# Patient Record
Sex: Male | Born: 1990 | Race: White | Hispanic: No | Marital: Married | Smoking: Current every day smoker
Health system: Southern US, Community
[De-identification: ages and names within clinical notes are randomized; demographics above are authoritative.]

---

## 2019-05-07 DIAGNOSIS — Z20822 Contact with and (suspected) exposure to covid-19: Secondary | ICD-10-CM | POA: Insufficient documentation

## 2019-05-07 DIAGNOSIS — F23 Brief psychotic disorder: Secondary | ICD-10-CM | POA: Insufficient documentation

## 2019-05-07 DIAGNOSIS — F17290 Nicotine dependence, other tobacco product, uncomplicated: Secondary | ICD-10-CM | POA: Insufficient documentation

## 2019-05-07 DIAGNOSIS — F1910 Other psychoactive substance abuse, uncomplicated: Secondary | ICD-10-CM | POA: Insufficient documentation
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
## 2019-05-08 ENCOUNTER — Encounter: Payer: Self-pay | Admitting: Emergency Medicine

## 2019-05-08 ENCOUNTER — Other Ambulatory Visit: Payer: Self-pay

## 2019-05-08 ENCOUNTER — Emergency Department
Admission: EM | Admit: 2019-05-08 | Discharge: 2019-05-08 | Disposition: A | Discharge status: Home / Self Care | Payer: Self-pay | Attending: Emergency Medicine | Admitting: Emergency Medicine

## 2019-05-08 ENCOUNTER — Emergency Department: Payer: Self-pay

## 2019-05-08 DIAGNOSIS — F23 Brief psychotic disorder: Secondary | ICD-10-CM

## 2019-05-08 DIAGNOSIS — R443 Hallucinations, unspecified: Secondary | ICD-10-CM | POA: Insufficient documentation

## 2019-05-08 DIAGNOSIS — F1490 Cocaine use, unspecified, uncomplicated: Secondary | ICD-10-CM

## 2019-05-08 DIAGNOSIS — F1290 Cannabis use, unspecified, uncomplicated: Secondary | ICD-10-CM

## 2019-05-08 DIAGNOSIS — F1910 Other psychoactive substance abuse, uncomplicated: Secondary | ICD-10-CM

## 2019-05-08 LAB — URINALYSIS, COMPLETE (UACMP) WITH MICROSCOPIC
Bilirubin Urine: NEGATIVE
Glucose, UA: NEGATIVE mg/dL
Hgb urine dipstick: NEGATIVE
Ketones, ur: 20 mg/dL — AB
Leukocytes,Ua: NEGATIVE
Nitrite: NEGATIVE
Protein, ur: 100 mg/dL — AB
Specific Gravity, Urine: 1.03 (ref 1.005–1.030)
pH: 5 (ref 5.0–8.0)

## 2019-05-08 LAB — URINE DRUG SCREEN, QUALITATIVE (ARMC ONLY)
Amphetamines, Ur Screen: POSITIVE — AB
Barbiturates, Ur Screen: NOT DETECTED
Benzodiazepine, Ur Scrn: POSITIVE — AB
Cannabinoid 50 Ng, Ur ~~LOC~~: POSITIVE — AB
Cocaine Metabolite,Ur ~~LOC~~: POSITIVE — AB
MDMA (Ecstasy)Ur Screen: NOT DETECTED
Methadone Scn, Ur: NOT DETECTED
Opiate, Ur Screen: NOT DETECTED
Phencyclidine (PCP) Ur S: NOT DETECTED
Tricyclic, Ur Screen: NOT DETECTED

## 2019-05-08 LAB — ETHANOL: Alcohol, Ethyl (B): <10 mg/dL (ref <10)

## 2019-05-08 LAB — SALICYLATE LEVEL: Salicylate Lvl: <7 mg/dL — ABNORMAL LOW (ref 7.0–30.0)

## 2019-05-08 LAB — CBC
HCT: 45.4 % (ref 39.0–52.0)
Hemoglobin: 15.5 g/dL (ref 13.0–17.0)
MCH: 30.1 pg (ref 26.0–34.0)
MCHC: 34.1 g/dL (ref 30.0–36.0)
MCV: 88.2 fL (ref 80.0–100.0)
Platelets: 299 10*3/uL (ref 150–400)
RBC: 5.15 MIL/uL (ref 4.22–5.81)
RDW: 12.6 % (ref 11.5–15.5)
WBC: 9.6 10*3/uL (ref 4.0–10.5)
nRBC: 0 % (ref 0.0–0.2)

## 2019-05-08 LAB — COMPREHENSIVE METABOLIC PANEL
ALT: 24 U/L (ref 0–44)
AST: 13 U/L — ABNORMAL LOW (ref 15–41)
Albumin: 4.9 g/dL (ref 3.5–5.0)
Alkaline Phosphatase: 58 U/L (ref 38–126)
Anion gap: 11 (ref 5–15)
BUN: 18 mg/dL (ref 6–20)
CO2: 26 mmol/L (ref 22–32)
Calcium: 9.4 mg/dL (ref 8.9–10.3)
Chloride: 97 mmol/L — ABNORMAL LOW (ref 98–111)
Creatinine, Ser: 0.95 mg/dL (ref 0.61–1.24)
GFR calc Af Amer: >60 mL/min (ref >60)
GFR calc non Af Amer: >60 mL/min (ref >60)
Glucose, Bld: 114 mg/dL — ABNORMAL HIGH (ref 70–99)
Potassium: 4 mmol/L (ref 3.5–5.1)
Sodium: 134 mmol/L — ABNORMAL LOW (ref 135–145)
Total Bilirubin: 2.5 mg/dL — ABNORMAL HIGH (ref 0.3–1.2)
Total Protein: 8.1 g/dL (ref 6.5–8.1)

## 2019-05-08 LAB — RESPIRATORY PANEL BY RT PCR (FLU A&B, COVID)
Influenza A by PCR: NEGATIVE
Influenza B by PCR: NEGATIVE
SARS Coronavirus 2 by RT PCR: NEGATIVE

## 2019-05-08 LAB — ACETAMINOPHEN LEVEL: Acetaminophen (Tylenol), Serum: <10 ug/mL — ABNORMAL LOW (ref 10–30)

## 2019-05-08 NOTE — Consult Note (Signed)
Patient is reevaluated by me this morning.  Patient continues to deny any suicidal homicidal ideations and denies any hallucinations.  Patient denies having any type of paranoia and has not presented with any type of aggressive or threatening behavior.  Patient was sleeping extremely well when I went into the room and was slightly difficult to wake up.  Patient reports that he lives with his parents and they will be the ones picking up from the hospital.  At this time the patient does not meet any psychiatric inpatient treatment criteria and is psychiatrically cleared.  Restated and encouraged patient to not use medications that are not prescribed to him.  He reports that he got the medications from a friend.  Patient stated understanding and agreement.  I have rescinded the patient's IVC and notified Dr. Lenard Lance of the recommendations.

## 2019-05-08 NOTE — ED Notes (Signed)
Pt calling for ride home. IVC papers rescinded.  Pt getting dressed back into regular clothes at this time. Pt is calm and cooperative

## 2019-05-08 NOTE — Discharge Instructions (Addendum)
You have been seen in the emergency department for a  psychiatric concern. You have been evaluated both medically as well as psychiatrically. Please follow-up with your outpatient resources provided. Return to the emergency department for any worsening symptoms, or any thoughts of hurting yourself or anyone else so that we may attempt to help you. 

## 2019-05-08 NOTE — ED Notes (Signed)
Pt dressed out and plaid pants, brown shirt, gray tennis shoes, white socks, plaid boxers, and changed into burgundy scrubs.

## 2019-05-08 NOTE — ED Provider Notes (Signed)
-----------------------------------------   9:25 AM on 05/08/2019 -----------------------------------------  Patient has been seen and evaluated by psychiatry.  They believe the patient safe for discharge home from a psychiatric standpoint.  Patient's medical work-up has been largely nonrevealing besides polysubstance abuse.  Patient will be discharged home.   Minna Antis, MD 05/08/19 [phone]
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
## 2019-05-08 NOTE — BH Assessment (Signed)
Writer spoke with the patient to complete an updated/reassessment. Patient denies SI/HI and AV/H.  Patient does not meet inpatient criteria. 
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
## 2019-05-08 NOTE — Consult Note (Signed)
[facility] Face-to-Face Psychiatry Consult   Reason for Consult:  Psych evaluation Referring Physician:  Dr. Dolores Frame Patient Identification: Frederick Petersen MRN:  [id number] Principal Diagnosis: Acute psychosis (HCC) Diagnosis:  Principal Problem:   Acute psychosis (HCC)   Total Time spent with patient: 45 minutes  Subjective:   Frederick Petersen is a 29 y.o. male patient admitted "I'M sleep deprived"  HPI:  Frederick Petersen, 29 y.o., male patient seen via telepsych by this provider; chart reviewed and consulted with Dr. Lucianne Muss on 05/08/19.  On evaluation Frederick Petersen reports that he has been taking 20mg  of adderall for the past 3 days to stay awake to post pokemon cards on the Internet for his cousin.  He also admits to taking 1.5mg  of zanax as well. He say he has anxiety and takes the zanax for that reason.  He attributes his  hallucinations to the fact that he hasnt gotten any sleep. Patient is insightful and his thought process is relavant and coherent. Patient educated on the dangers of taking medication that isn't prescribed for him.  Patient conveyed understanding by verbally agreeing. At this time there is indication that he is currently experiencing delusional thought content. Patient denies SI, HI, and AVH.  Writer recommends overnight  observation and DC in the am is pschiatrically stable.    Past Psychiatric History: NO  Risk to Self:   Risk to Others:   Prior Inpatient Therapy:   Prior Outpatient Therapy:    Past Medical History: History reviewed. No pertinent past medical history. History reviewed. No pertinent surgical history. Family History: No family history on file. Family Psychiatric  History: unknown Social History:  Social History   Substance and Sexual Activity  Alcohol Use Yes     Social History   Substance and Sexual Activity  Drug Use Never    Social History   Socioeconomic History  . Marital status: Married    Spouse name: Not on file  . Number of children: Not  on file  . Years of education: Not on file  . Highest education level: Not on file  Occupational History  . Not on file  Tobacco Use  . Smoking status: Current Every Day Smoker    Types: E-cigarettes  . Smokeless tobacco: Never Used  Substance and Sexual Activity  . Alcohol use: Yes  . Drug use: Never  . Sexual activity: Not on file  Other Topics Concern  . Not on file  Social History Narrative  . Not on file   Social Determinants of Health   Financial Resource Strain:   . Difficulty of Paying Living Expenses:   Food Insecurity:   . Worried About in the Last Year:   . Programme researcher, broadcasting/film/video in the Last Year:   Transportation Needs:   . Barista (Medical):   Freight forwarder Lack of Transportation (Non-Medical):   Physical Activity:   . Days of Exercise per Week:   . Minutes of Exercise per Session:   Stress:   . Feeling of Stress :   Social Connections:   . Frequency of Communication with Friends and Family:   . Frequency of Social Gatherings with Friends and Family:   . Attends Religious Services:   . Active Member of Clubs or Organizations:   . Attends [employer] Meetings:   Banker Marital Status:    Additional Social History:    Allergies:  No Known Allergies  Labs:  Results for orders placed or performed during the  hospital encounter of 05/08/19 (from the past 48 hour(s))  Comprehensive metabolic panel     Status: Abnormal   Collection Time: 05/08/19 12:09 AM  Result Value Ref Range   Sodium 134 (L) 135 - 145 mmol/L   Potassium 4.0 3.5 - 5.1 mmol/L   Chloride 97 (L) 98 - 111 mmol/L   CO2 26 22 - 32 mmol/L   Glucose, Bld 114 (H) 70 - 99 mg/dL    Comment: Glucose reference range applies only to samples taken after fasting for at least 8 hours.   BUN 18 6 - 20 mg/dL   Creatinine, Ser 6.28 0.61 - 1.24 mg/dL   Calcium 9.4 8.9 - 36.6 mg/dL   Total Protein 8.1 6.5 - 8.1 g/dL   Albumin 4.9 3.5 - 5.0 g/dL   AST 13 (L) 15 - 41 U/L   ALT 24 0 -  44 U/L   Alkaline Phosphatase 58 38 - 126 U/L   Total Bilirubin 2.5 (H) 0.3 - 1.2 mg/dL   GFR calc non Af Amer >60 >60 mL/min   GFR calc Af Amer >60 >60 mL/min   Anion gap 11 5 - 15    Comment: Performed at [facility], [address] [place], [place] [address]  Ethanol     Status: None   Collection Time: 05/08/19 12:09 AM  Result Value Ref Range   Alcohol, Ethyl (B) <10 <10 mg/dL    Comment: (NOTE) Lowest detectable limit for serum alcohol is 10 mg/dL. For medical purposes only. Performed at [facility], [address] [place], [place] [address]   Salicylate level     Status: Abnormal   Collection Time: 05/08/19 12:09 AM  Result Value Ref Range   Salicylate Lvl <7.0 (L) 7.0 - 30.0 mg/dL    Comment: Performed at [facility], [address] [place], [place] [address]  Acetaminophen level     Status: Abnormal   Collection Time: 05/08/19 12:09 AM  Result Value Ref Range   Acetaminophen (Tylenol), Serum <10 (L) 10 - 30 ug/mL    Comment: (NOTE) Therapeutic concentrations vary significantly. A range of 10-30 ug/mL  may be an effective concentration for many patients. However, some  are best treated at concentrations outside of this range. Acetaminophen concentrations >150 ug/mL at 4 hours after ingestion  and >50 ug/mL at 12 hours after ingestion are often associated with  toxic reactions. Performed at [facility], [address] [place], [place] [address]   cbc     Status: None   Collection Time: 05/08/19 12:09 AM  Result Value Ref Range   WBC 9.6 4.0 - 10.5 K/uL   RBC 5.15 4.22 - 5.81 MIL/uL   Hemoglobin 15.5 13.0 - 17.0 g/dL   HCT 51.7 00.1 - 74.9 %   MCV 88.2 80.0 - 100.0 fL   MCH 30.1 26.0 - 34.0 pg   MCHC 34.1 30.0 - 36.0 g/dL   RDW 44.9 67.5 - 91.6 %   Platelets 299 150 - 400 K/uL   nRBC 0.0 0.0 - 0.2 %    Comment: Performed at [facility], [address] [place], [place] [address]    No current  facility-administered medications for this encounter.   No current outpatient medications on file.    Musculoskeletal: Strength & Muscle Tone: within normal limits Gait & Station: normal Patient leans: N/A  Psychiatric Specialty Exam: Physical Exam  Nursing note and vitals reviewed. Constitutional: He is oriented to person, place, and  time. He appears well-developed.  Eyes: Pupils are equal, round, and reactive to light.  Respiratory: Effort normal.  Musculoskeletal:        General: Normal range of motion.     Cervical back: Normal range of motion.  Neurological: He is alert and oriented to person, place, and time.  Skin: Skin is warm and dry.  Psychiatric: His speech is normal and behavior is normal. His mood appears anxious. Thought content is delusional. Cognition and memory are impaired. He expresses impulsivity.    Review of Systems  Psychiatric/Behavioral: Positive for hallucinations and sleep disturbance. Negative for behavioral problems, self-injury and suicidal ideas.  All other systems reviewed and are negative.   Blood pressure (!) 133/96, pulse 96, temperature 98.4 F (36.9 C), temperature source Oral, resp. rate 18, height 5\' 7"  (1.702 m), weight 56.7 kg, SpO2 100 %.Body mass index is 19.58 kg/m.  General Appearance: Casual  Eye Contact:  Minimal  Speech:  Clear and Coherent  Volume:  Decreased  Mood:  Dysphoric  Affect:  Congruent  Thought Process:  Coherent and Descriptions of Associations: Intact  Orientation:  Full (Time, Place, and Person)  Thought Content:  Logical  Suicidal Thoughts:  No  Homicidal Thoughts:  No  Memory:  Immediate;   Fair  Judgement:  Impaired  Insight:  Fair  Psychomotor Activity:  Normal  Concentration:  Concentration: Good  Recall:  Shirley of Knowledge:  Good  Language:  Good  Akathisia:  NA  Handed:  Right  AIMS (if indicated):     Assets:  Desire for Improvement  ADL's:  Intact  Cognition:  WNL  Sleep:   3hrs of  sleefor the last 4 days     Treatment Plan Summary: Dc in the am after a night rest and re-evaluation   Disposition: Discussed crisis plan, support from social network, calling 911, coming to the Emergency Department, and calling Suicide Hotline.  Frederick Lair, NP 05/08/2019 1:36 AM

## 2020-09-17 IMAGING — CT CT HEAD W/O CM
3 series · 16 of 46 positions shown, 19 images · non-contrast
Comparison: None.

CLINICAL DATA: Hallucinations.

EXAM:
CT HEAD WITHOUT CONTRAST
TECHNIQUE: Contiguous axial images were obtained from the base of the skull
through the vertex without intravenous contrast.

[Series 2: head wo · axial · 0.39mm/px · z∈[-102,+18]mm · 10 of 29 slices shown, 13 images]
[im 3/29  brain]
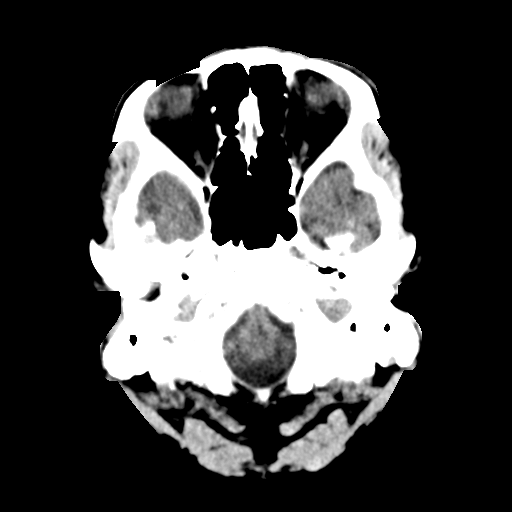
[im 3/29  bone]
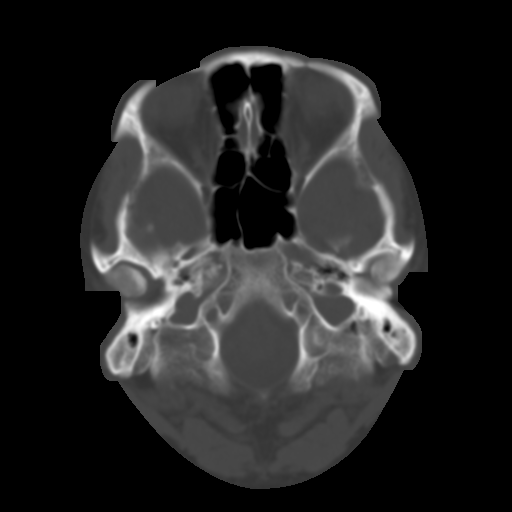
[im 6/29  brain]
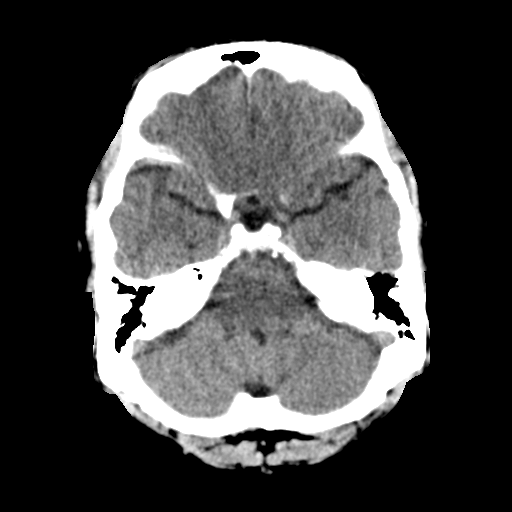
[im 8/29  brain]
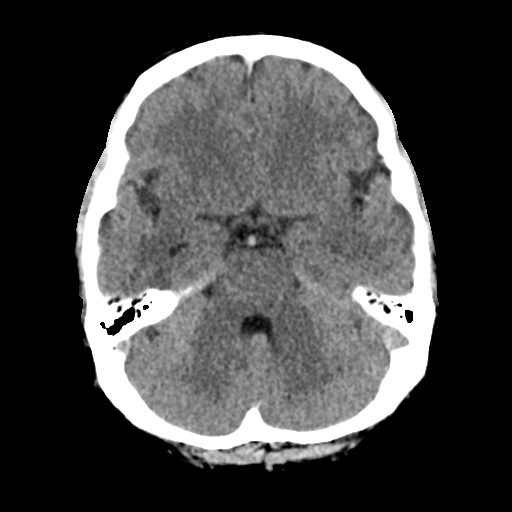
[im 11/29  brain]
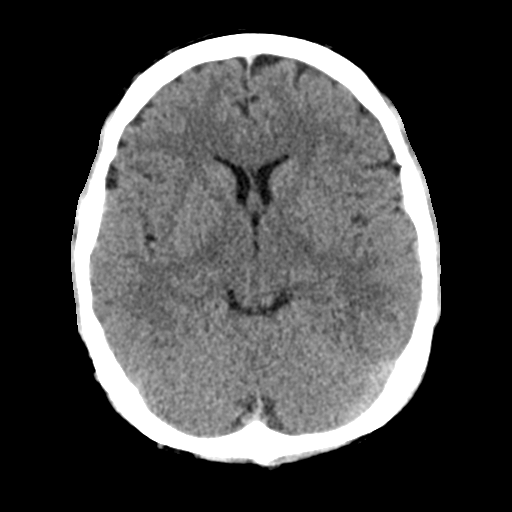
[im 14/29  brain]
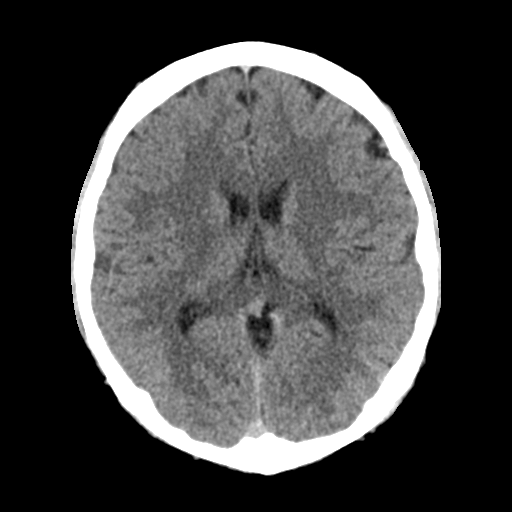
[im 14/29  bone]
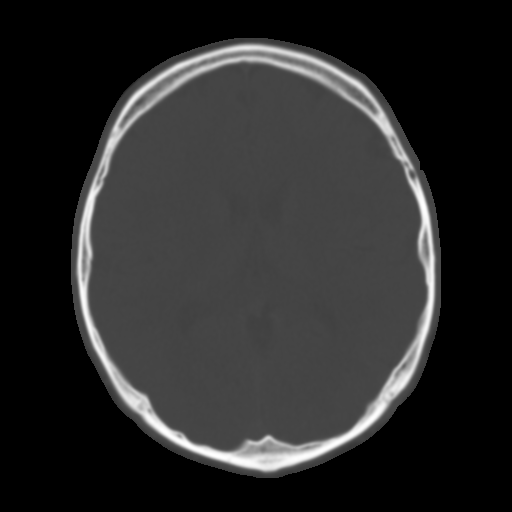
[im 16/29  brain]
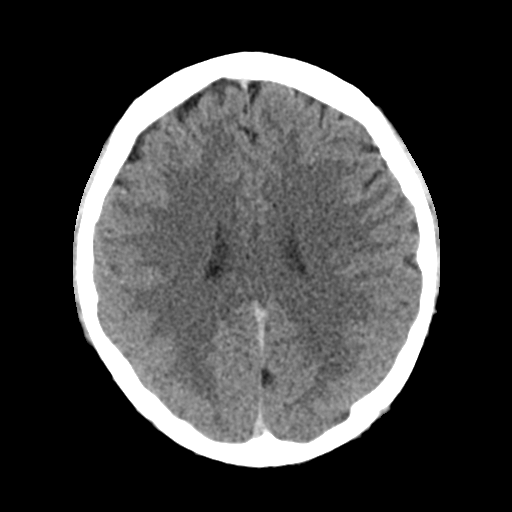
[im 19/29  brain]
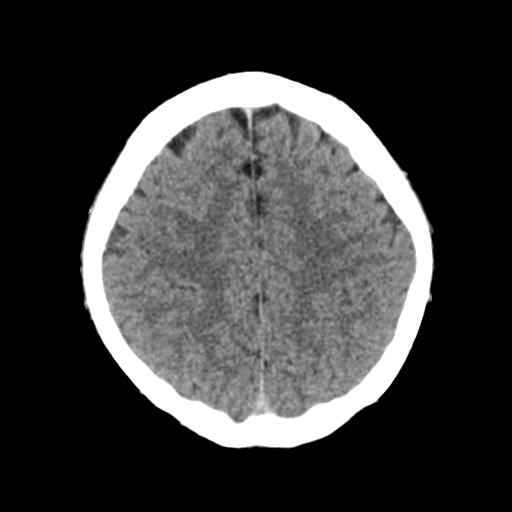
[im 22/29  brain]
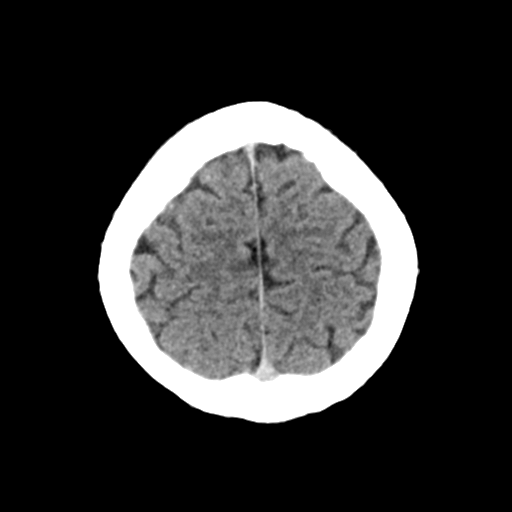
[im 24/29  brain]
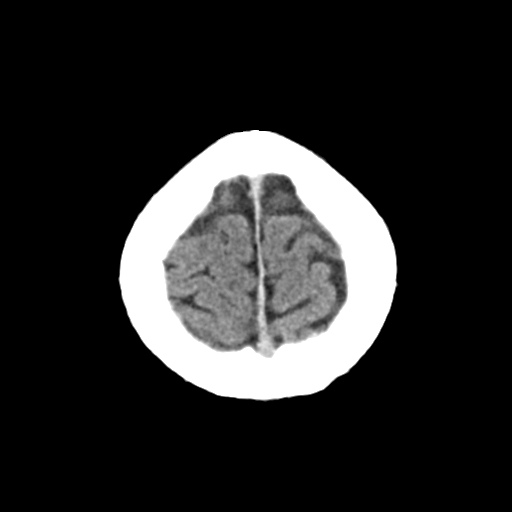
[im 24/29  bone]
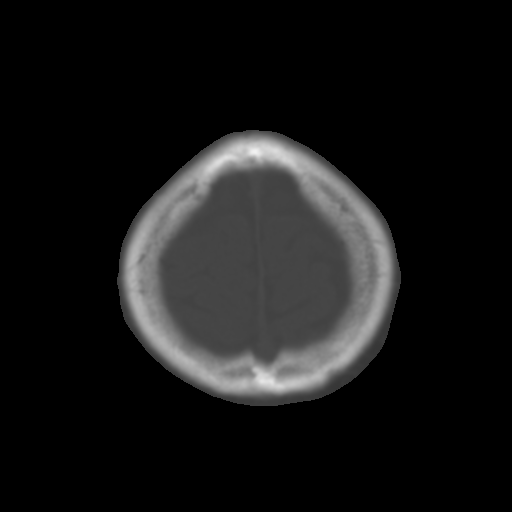
[im 27/29  brain]
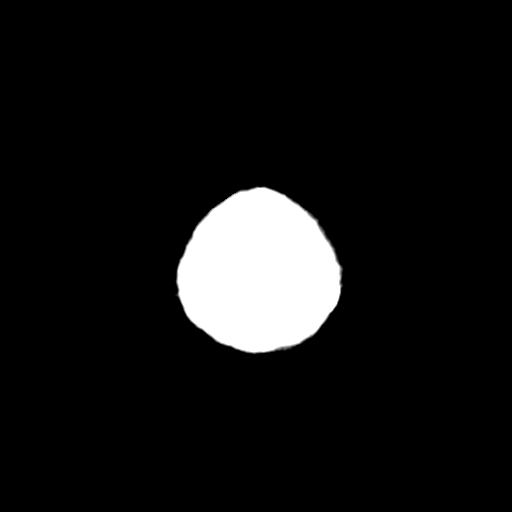

[Series 4: coronal soft tissue · coronal · 0.31mm/px · 3 of 58 slices shown]
[im 20/58  brain]
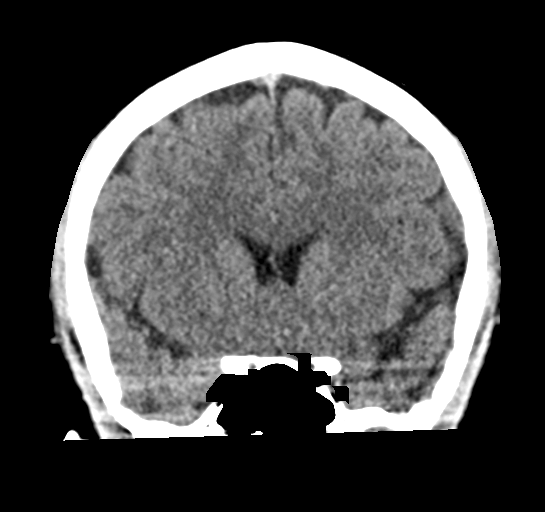
[im 26/58  brain]
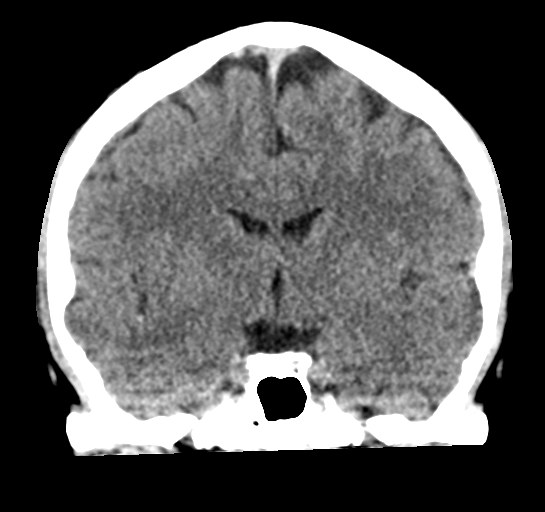
[im 32/58  brain]
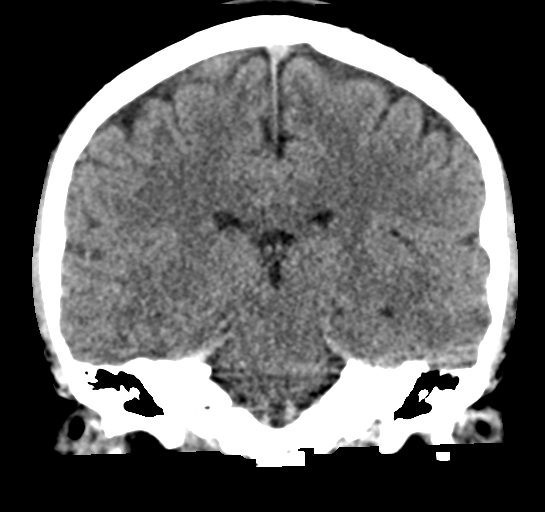

[Series 5: sagittal soft tissue · sagittal · 0.31mm/px · 3 of 51 slices shown]
[im 17/51  brain]
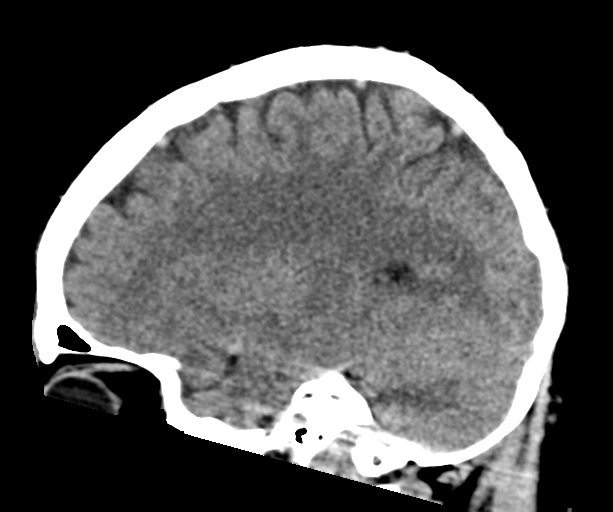
[im 26/51  brain]
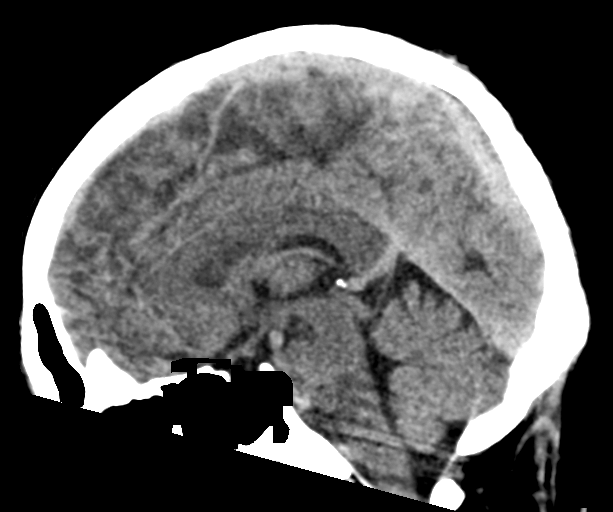
[im 34/51  brain]
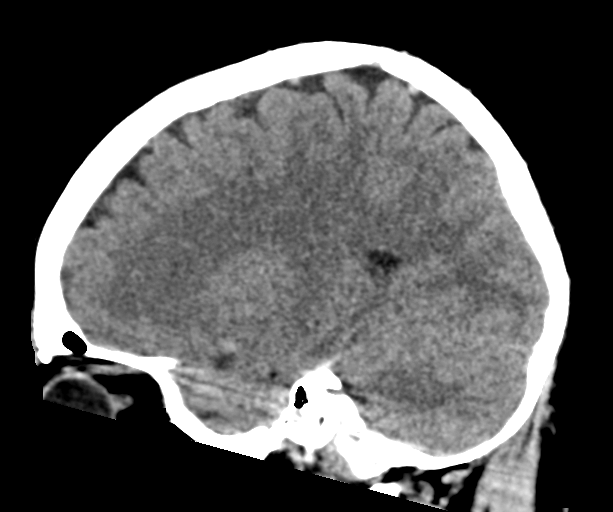

[16 of 46 positions shown; findings below may reference images not displayed]

FINDINGS: Brain: No evidence of acute infarction, hemorrhage, hydrocephalus,
extra-axial collection or mass lesion/mass effect.

Vascular: No hyperdense vessel or unexpected calcification.

Skull: Normal. Negative for fracture or focal lesion.

Sinuses/Orbits: No acute finding.

Other: None.
IMPRESSION: No acute intracranial pathology.
